# Patient Record
Sex: Male | Born: 1970 | Race: White | Hispanic: No | Marital: Married | Smoking: Never smoker
Health system: Southern US, Community
[De-identification: ages and names within clinical notes are randomized; demographics above are authoritative.]

## PROBLEM LIST (undated history)

## (undated) DIAGNOSIS — K219 Gastro-esophageal reflux disease without esophagitis: Secondary | ICD-10-CM

## (undated) HISTORY — PX: TOE SURGERY: SHX1073

---

## 2012-06-15 ENCOUNTER — Emergency Department (HOSPITAL_COMMUNITY)
Admission: EM | Admit: 2012-06-15 | Discharge: 2012-06-16 | Disposition: A | Discharge status: Home / Self Care | Payer: BC Managed Care – PPO | Attending: Emergency Medicine | Admitting: Emergency Medicine

## 2012-06-15 ENCOUNTER — Encounter (HOSPITAL_COMMUNITY): Payer: Self-pay | Admitting: *Deleted

## 2012-06-15 DIAGNOSIS — Z881 Allergy status to other antibiotic agents status: Secondary | ICD-10-CM | POA: Insufficient documentation

## 2012-06-15 DIAGNOSIS — R079 Chest pain, unspecified: Secondary | ICD-10-CM

## 2012-06-15 LAB — POCT I-STAT TROPONIN I: Troponin i, poc: 0 ng/mL (ref 0.00–0.08)

## 2012-06-15 LAB — CBC
Platelets: 257 10*3/uL (ref 150–400)
RBC: 4.84 MIL/uL (ref 4.22–5.81)
RDW: 12.2 % (ref 11.5–15.5)
WBC: 6.7 10*3/uL (ref 4.0–10.5)

## 2012-06-15 LAB — BASIC METABOLIC PANEL
CO2: 29 mEq/L (ref 19–32)
Chloride: 103 mEq/L (ref 96–112)
GFR calc Af Amer: >90 mL/min (ref >90)
Potassium: 3.8 mEq/L (ref 3.5–5.1)
Sodium: 139 mEq/L (ref 135–145)

## 2012-06-15 MED ORDER — NITROGLYCERIN 0.4 MG SL SUBL: 0.4000 mg | SUBLINGUAL_TABLET | SUBLINGUAL | Status: DC | PRN

## 2012-06-15 MED ORDER — ASPIRIN 81 MG PO CHEW
324.0000 mg | CHEWABLE_TABLET | Freq: Once | ORAL | Status: AC
  Administered 2012-06-16: 324 mg via ORAL
  Filled 2012-06-15: qty 4

## 2012-06-15 NOTE — ED Notes (Signed)
Pt in c/o intermittent chest pain over the last week, increased today in pain and frequency, described as sharp pain to left side of chest, some shortness of breath with this, some radiation into his left arm and neck

## 2012-06-16 ENCOUNTER — Emergency Department (HOSPITAL_COMMUNITY): Payer: BC Managed Care – PPO

## 2012-06-16 LAB — D-DIMER, QUANTITATIVE: D-Dimer, Quant: 0.28 ug/mL-FEU (ref 0.00–0.48)

## 2012-06-16 MED ORDER — FAMOTIDINE 20 MG PO TABS: 20.0000 mg | ORAL_TABLET | Freq: Two times a day (BID) | ORAL | Status: DC

## 2012-06-16 NOTE — ED Notes (Signed)
The patient is AOx4 and comfortable with the discharge instructions. 

## 2012-06-16 NOTE — ED Provider Notes (Signed)
History     CSN: [id number]  Arrival date & time 06/15/12  2131   First MD Initiated Contact with Patient 06/15/12 2351      Chief Complaint  Patient presents with  . Chest Pain    (Consider location/radiation/quality/duration/timing/severity/associated sxs/prior treatment) HPI Hx per PT - CP L sided on and off for the last week.  Described as piercing sharp pains that come and go, no known aggravating or alleviating factors, pain lasts seconds, is catching which makes him feel like he cant catch his breath. No recent travel or surgery, denies any h/o tob use or and PMHx. No FH CAD. He had an episode like this about a year ago, it went away without intervention and he did not seek any medical care at that time. Currently pain free. No leg pain, swelling or h/o DVT/ PE. Over the last few days has had some L arm discomfort that feels like he slept on it wrong - he does not feel like this is related to these sharp CPs. He does get heartburn and takes occ Tums  History reviewed. No pertinent past medical history.  No past surgical history on file.  No family history on file.  History  Substance Use Topics  . Smoking status: Not on file  . Smokeless tobacco: Not on file  . Alcohol Use: Not on file      Review of Systems  Constitutional: Negative for fever and chills.  HENT: Negative for neck pain and neck stiffness.   Eyes: Negative for pain.  Respiratory: Negative for cough.   Cardiovascular: Positive for chest pain.  Gastrointestinal: Negative for abdominal pain.  Genitourinary: Negative for dysuria.  Musculoskeletal: Negative for back pain.  Skin: Negative for rash.  Neurological: Negative for headaches.  All other systems reviewed and are negative.    Allergies  Vancomycin  Home Medications  No current outpatient prescriptions on file.  BP 119/73  Pulse 62  Temp(Src) 98.2 F (36.8 C) (Oral)  Resp 12  SpO2 97%  Physical Exam  Constitutional: He is oriented  to person, place, and time. He appears well-developed and well-nourished.  HENT:  Head: Normocephalic and atraumatic.  Eyes: Conjunctivae and EOM are normal. Pupils are equal, round, and reactive to light.  Neck: Trachea normal. Neck supple. No thyromegaly present.  Cardiovascular: Normal rate, regular rhythm, S1 normal, S2 normal and normal pulses.     No systolic murmur is present   No diastolic murmur is present  Pulses:      Radial pulses are 2+ on the right side, and 2+ on the left side.  Pulmonary/Chest: Effort normal and breath sounds normal. He has no wheezes. He has no rhonchi. He has no rales. He exhibits no tenderness.  Abdominal: Soft. Normal appearance and bowel sounds are normal. There is no tenderness. There is no CVA tenderness and negative Murphy's sign.  Musculoskeletal:  calves nontender, no cords or erythema, negative Homans sign  Neurological: He is alert and oriented to person, place, and time. He has normal strength. No cranial nerve deficit or sensory deficit. GCS eye subscore is 4. GCS verbal subscore is 5. GCS motor subscore is 6.  Skin: Skin is warm and dry. No rash noted. He is not diaphoretic.  Psychiatric: His speech is normal.  Cooperative and appropriate    ED Course  Procedures (including critical care time)  Results for orders placed during the hospital encounter of 06/15/12  CBC      Result Value Range  WBC 6.7  4.0 - 10.5 K/uL   RBC 4.84  4.22 - 5.81 MIL/uL   Hemoglobin 14.9  13.0 - 17.0 g/dL   HCT 16.1  09.6 - 04.5 %   MCV 86.8  78.0 - 100.0 fL   MCH 30.8  26.0 - 34.0 pg   MCHC 35.5  30.0 - 36.0 g/dL   RDW 40.9  81.1 - 91.4 %   Platelets 257  150 - 400 K/uL  BASIC METABOLIC PANEL      Result Value Range   Sodium 139  135 - 145 mEq/L   Potassium 3.8  3.5 - 5.1 mEq/L   Chloride 103  96 - 112 mEq/L   CO2 29  19 - 32 mEq/L   Glucose, Bld 86  70 - 99 mg/dL   BUN 17  6 - 23 mg/dL   Creatinine, Ser 7.82  0.50 - 1.35 mg/dL   Calcium 9.8  8.4  - 95.6 mg/dL   GFR calc non Af Amer >90  >90 mL/min   GFR calc Af Amer >90  >90 mL/min  D-DIMER, QUANTITATIVE      Result Value Range   D-Dimer, Quant 0.28  0.00 - 0.48 ug/mL-FEU  POCT I-STAT TROPONIN I      Result Value Range   Troponin i, poc 0.00  0.00 - 0.08 ng/mL   Comment 3            Dg Chest Portable 1 View  06/16/2012   *RADIOLOGY REPORT*  Clinical Data: Chest pain shortness of breath  PORTABLE CHEST - 1 VIEW  Comparison: None.  Findings: Cardiac leads project over the chest.  Heart, mediastinal, and hilar contours are normal.  The lungs are normally expanded and clear.  Negative for pleural effusion or pneumothorax. Bony thorax is unremarkable.  IMPRESSION: No acute cardiopulmonary disease.   Original Report Authenticated By: Britta Mccreedy, M.D.    Date: 06/16/2012  Rate: 68  Rhythm: normal sinus rhythm  QRS Axis: normal  Intervals: normal  ST/T Wave abnormalities: nonspecific ST changes  Conduction Disutrbances:none  Narrative Interpretation:   Old EKG Reviewed: none available  ASA  1:52 AM pain free in the ED, plan follow up outpatient cardiac stress testing and with PCP. CP precautions provided and verbalized as understood.   MDM  CP x one week, atypical for ACS and low risk for the same. H/o gerd  ECG, labs, CXR  ASA  VS and nursing notes reviewed        Sunnie Nielsen, MD 06/16/12 [phone]

## 2012-07-11 DIAGNOSIS — R079 Chest pain, unspecified: Secondary | ICD-10-CM

## 2014-06-27 IMAGING — CR DG CHEST 1V PORT
1 series · 1 of 1 positions shown · non-contrast
Comparison: None.

CLINICAL DATA: Chest pain shortness of breath

PORTABLE CHEST - 1 VIEW

[AP]
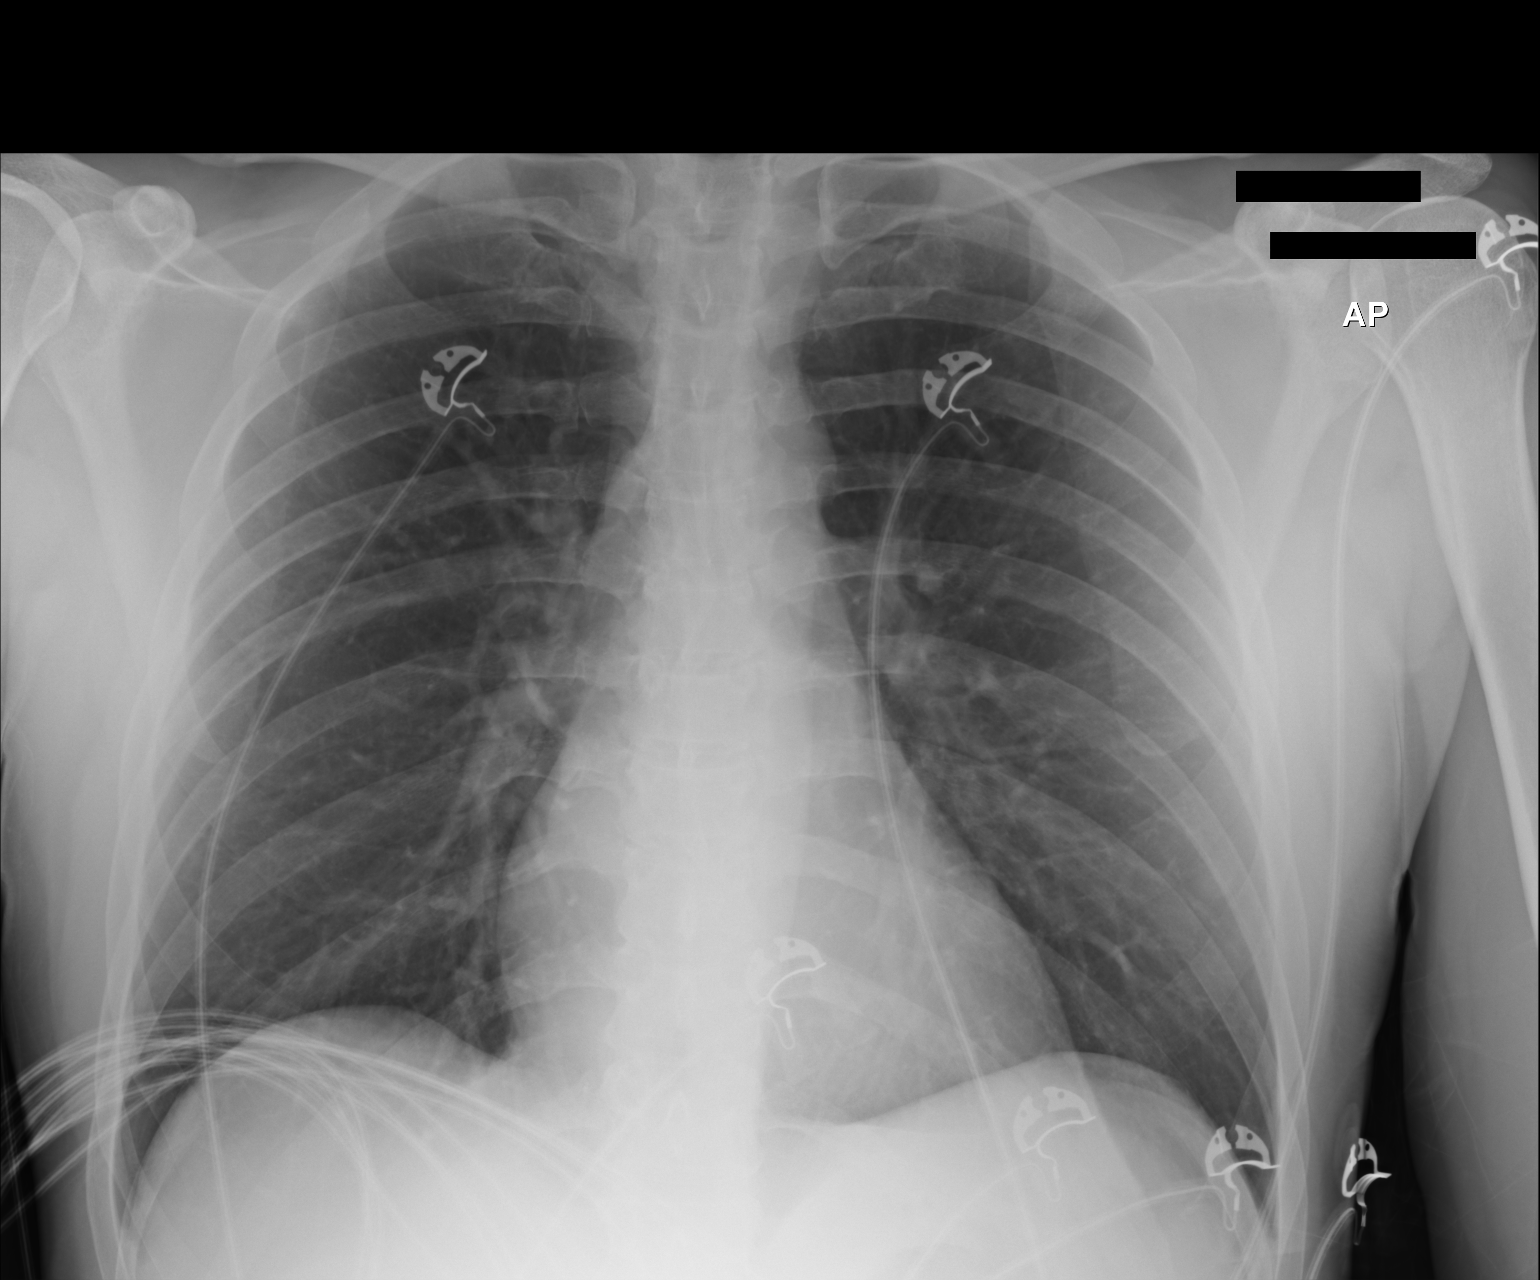

[1 of 1 positions shown; findings below may reference images not displayed]

FINDINGS: Cardiac leads project over the chest.  Heart,
mediastinal, and hilar contours are normal.  The lungs are normally
expanded and clear.  Negative for pleural effusion or pneumothorax.
Bony thorax is unremarkable.
IMPRESSION: No acute cardiopulmonary disease.

## 2020-02-25 ENCOUNTER — Encounter (INDEPENDENT_AMBULATORY_CARE_PROVIDER_SITE_OTHER): Payer: Self-pay | Admitting: *Deleted

## 2020-05-25 ENCOUNTER — Encounter (INDEPENDENT_AMBULATORY_CARE_PROVIDER_SITE_OTHER): Payer: Self-pay | Admitting: Gastroenterology

## 2020-05-25 ENCOUNTER — Other Ambulatory Visit: Payer: Self-pay

## 2020-05-25 ENCOUNTER — Telehealth (INDEPENDENT_AMBULATORY_CARE_PROVIDER_SITE_OTHER): Payer: Commercial Managed Care - PPO | Admitting: Gastroenterology

## 2020-05-25 DIAGNOSIS — R1319 Other dysphagia: Secondary | ICD-10-CM

## 2020-05-25 DIAGNOSIS — K219 Gastro-esophageal reflux disease without esophagitis: Secondary | ICD-10-CM | POA: Diagnosis not present

## 2020-05-25 DIAGNOSIS — R1310 Dysphagia, unspecified: Secondary | ICD-10-CM | POA: Insufficient documentation

## 2020-05-25 MED ORDER — PANTOPRAZOLE SODIUM 40 MG PO TBEC: 40.0000 mg | DELAYED_RELEASE_TABLET | Freq: Every day | ORAL | 3 refills | Status: DC

## 2020-05-25 NOTE — Patient Instructions (Signed)
Schedule EGD and colonoscopy Decrease pantoprazole to 40 mg qday

## 2020-05-25 NOTE — Progress Notes (Signed)
Michael Fox, M.D. Gastroenterology & Hepatology [facility] [address] [place], [place] [address] Primary Care Physician: Michael Hilding, MD Michael Fox [id number]  Referring MD: PCP  This is a telephone virtual visit.  It required patient-provider interaction for the medical decision making as documented below. The patient has consented and agreed to proceed with a Telehealth encounter given the current Coronavirus pandemic.  VIRTUAL VISIT NOTE Patient location: home Provider location: home  I will communicate my assessment and recommendations to the referring MD via EMR.  Chief Complaint: Heartburn and dysphagia  History of Present Illness: Michael Fox is a 50 y.o. male with PMH GERD, who presents for evaluation of  Heartburn.  Patient reports that close to a year and a half ago he presented new onset of heartburn. The symptoms were quite frequent in the past, he cannot remember how often he had him. He reports that he started taking pantoprazole 40 mg qday at least a year ago which was prescribed by his PCP, usually 45 minutes before his breakfast.  He states he was switched to twice a day dosing in a subsequent appointment 2 months ago as it seems he had recurrent symptoms but he does not remember exactly if he had significant symptoms at that time. The patient reports that he has been taking pantoprazole 40 mg twice a day non compliantly, as he takes it 80% of the time in the afternoon, as sometimes he forgets the evening dose . Nevertheless, he has not presented any breakthrough heartburn episodes despite skipping the doses.  He reports that all his life he has presented some episodes of feeling the "food got stuck" in the chest when he eats very fast. Sometimes he has had to induce his vomit as food does not go down.  Never had a food impaction episode in the past.  The patient also states he has felt a lump in  his perianal area. His PCP evaluated this told him the mass was a skin tag but he would like to have this checked in a colonoscopy.  The patient denies having any odynophagia, nausea, vomiting, fever, chills, hematochezia, melena, hematemesis, abdominal distention, abdominal pain, diarrhea, jaundice, pruritus or weight gain/loss.  Last EGD: never Last Colonoscopy: never  FHx: neg for any gastrointestinal/liver disease, father had cancer but unknown type, mother breast cancer Social: neg smoking, alcohol or illicit drug use Surgical: non contributory  Past Medical History:GERD  Past Surgical History:History reviewed. No pertinent surgical history.  Family History:History reviewed. No pertinent family history.  Social History: Social History   Tobacco Use  Smoking Status Never Smoker  Smokeless Tobacco Never Used   Social History   Substance and Sexual Activity  Alcohol Use Yes   Comment: occasional   Social History   Substance and Sexual Activity  Drug Use Never    Allergies: Allergies  Allergen Reactions  . Vancomycin Itching    Medications: Current Outpatient Medications  Medication Sig Dispense Refill  . pantoprazole (PROTONIX) 40 MG tablet Take 1 tablet by mouth 2 (two) times daily.     No current facility-administered medications for this visit.    Review of Systems: GENERAL: negative for malaise, night sweats HEENT: No changes in hearing or vision, no nose bleeds or other nasal problems. NECK: Negative for lumps, goiter, pain and significant neck swelling RESPIRATORY: Negative for cough, wheezing CARDIOVASCULAR: Negative for chest pain, leg swelling, palpitations, orthopnea GI: SEE HPI MUSCULOSKELETAL: Negative  for joint pain or swelling, back pain, and muscle pain. SKIN: Negative for lesions, rash PSYCH: Negative for sleep disturbance, mood disorder and recent psychosocial stressors. HEMATOLOGY Negative for prolonged bleeding, bruising easily, and  swollen nodes. ENDOCRINE: Negative for cold or heat intolerance, polyuria, polydipsia and goiter. NEURO: negative for tremor, gait imbalance, syncope and seizures. The remainder of the review of systems is noncontributory.   Physical Exam: No exam was performed as this was a telephone encounter  Imaging/Labs: as above  I personally reviewed and interpreted the available labs, imaging and endoscopic files.  Impression and Plan: Michael Fox is a 50 y.o. male with PMH GERD, who presents for evaluation of heartburn.  The patient had presence of frequent symptoms of heartburn which have responded to the intake of PPI, which makes a diagnosis of GERD very likely.  I am not sure if he is having any benefit from twice a day dosing, I recommended patient to decrease his dosage to once a day dosing.  He is currently taking the medication in a proper fashion which I encouraged him to keep doing.  Nevertheless, he has presented some episodes of dysphagia which will be important to be evaluated with an EGD.  Patient understood and agreed.  We will also schedule a colonoscopy for screening purposes as he has never had 1 in the past.  This will also allow [place] to evaluate his rectal complaint.  - Schedule EGD and colonoscopy - Decrease pantoprazole to 40 mg qday  All questions were answered.      Total visit time: I spent a total of  40 minutes  Michael Peppers, MD Gastroenterology and Hepatology [facility]

## 2020-05-26 ENCOUNTER — Encounter (INDEPENDENT_AMBULATORY_CARE_PROVIDER_SITE_OTHER): Payer: Self-pay

## 2020-05-26 ENCOUNTER — Telehealth (INDEPENDENT_AMBULATORY_CARE_PROVIDER_SITE_OTHER): Payer: Self-pay

## 2020-05-26 MED ORDER — PEG 3350-KCL-NA BICARB-NACL 420 G PO SOLR: 4000.0000 mL | ORAL | 0 refills | Status: DC

## 2020-05-26 NOTE — Telephone Encounter (Signed)
LeighAnn Tashera Montalvo, CMA  

## 2020-06-29 ENCOUNTER — Ambulatory Visit (HOSPITAL_COMMUNITY): Payer: Commercial Managed Care - PPO | Admitting: Anesthesiology

## 2020-06-29 ENCOUNTER — Other Ambulatory Visit: Payer: Self-pay

## 2020-06-29 ENCOUNTER — Ambulatory Visit (HOSPITAL_COMMUNITY)
Admission: RE | Admit: 2020-06-29 | Discharge: 2020-06-29 | Disposition: A | Discharge status: Home / Self Care | Payer: Commercial Managed Care - PPO | Attending: Gastroenterology | Admitting: Gastroenterology

## 2020-06-29 ENCOUNTER — Encounter (HOSPITAL_COMMUNITY): Payer: Self-pay | Admitting: Gastroenterology

## 2020-06-29 ENCOUNTER — Encounter (HOSPITAL_COMMUNITY)
Admission: RE | Disposition: A | Discharge status: Home / Self Care | Payer: Self-pay | Source: Home / Self Care | Attending: Gastroenterology

## 2020-06-29 DIAGNOSIS — R1310 Dysphagia, unspecified: Secondary | ICD-10-CM | POA: Insufficient documentation

## 2020-06-29 DIAGNOSIS — D3A8 Other benign neuroendocrine tumors: Secondary | ICD-10-CM | POA: Insufficient documentation

## 2020-06-29 DIAGNOSIS — K219 Gastro-esophageal reflux disease without esophagitis: Secondary | ICD-10-CM | POA: Diagnosis not present

## 2020-06-29 DIAGNOSIS — K621 Rectal polyp: Secondary | ICD-10-CM

## 2020-06-29 DIAGNOSIS — Z881 Allergy status to other antibiotic agents status: Secondary | ICD-10-CM | POA: Diagnosis not present

## 2020-06-29 DIAGNOSIS — Z1211 Encounter for screening for malignant neoplasm of colon: Secondary | ICD-10-CM | POA: Insufficient documentation

## 2020-06-29 DIAGNOSIS — Z79899 Other long term (current) drug therapy: Secondary | ICD-10-CM | POA: Diagnosis not present

## 2020-06-29 DIAGNOSIS — D120 Benign neoplasm of cecum: Secondary | ICD-10-CM | POA: Insufficient documentation

## 2020-06-29 HISTORY — PX: COLONOSCOPY WITH PROPOFOL: SHX5780

## 2020-06-29 HISTORY — PX: BIOPSY: SHX5522

## 2020-06-29 HISTORY — PX: POLYPECTOMY: SHX149

## 2020-06-29 HISTORY — PX: ESOPHAGOGASTRODUODENOSCOPY (EGD) WITH PROPOFOL: SHX5813

## 2020-06-29 LAB — HM COLONOSCOPY

## 2020-06-29 SURGERY — COLONOSCOPY WITH PROPOFOL
Anesthesia: General

## 2020-06-29 MED ORDER — GLYCOPYRROLATE 0.2 MG/ML IJ SOLN
INTRAMUSCULAR | Status: DC | PRN
  Administered 2020-06-29: .2 mg via INTRAVENOUS

## 2020-06-29 MED ORDER — PROPOFOL 10 MG/ML IV BOLUS
INTRAVENOUS | Status: DC | PRN
  Administered 2020-06-29: 100 mg via INTRAVENOUS
  Administered 2020-06-29: 150 ug/kg/min via INTRAVENOUS

## 2020-06-29 MED ORDER — STERILE WATER FOR IRRIGATION IR SOLN
Status: DC | PRN
  Administered 2020-06-29: 200 mL

## 2020-06-29 MED ORDER — LACTATED RINGERS IV SOLN
INTRAVENOUS | Status: DC
  Administered 2020-06-29: 1000 mL via INTRAVENOUS

## 2020-06-29 MED ORDER — LIDOCAINE HCL (CARDIAC) PF 100 MG/5ML IV SOSY
PREFILLED_SYRINGE | INTRAVENOUS | Status: DC | PRN
  Administered 2020-06-29: 100 mg via INTRAVENOUS

## 2020-06-29 NOTE — Op Note (Addendum)
[facility] Patient Name: Michael Fox Procedure Date: 06/29/2020 9:27 AM MRN: [id number] Date of Birth: [birth year] Attending MD: Maylon Peppers ,  CSN: [id number] Age: 50 Admit Type: Outpatient Procedure:                Colonoscopy Indications:              Screening for colorectal malignant neoplasm Providers:                Maylon Peppers, Robins Page, Alma Risa Grill, Technician Referring MD:              Medicines:                Monitored Anesthesia Care Complications:            No immediate complications. Estimated Blood Loss:     Estimated blood loss: none. Procedure:                Pre-Anesthesia Assessment:                           - Prior to the procedure, a History and Physical                            was performed, and patient medications, allergies                            and sensitivities were reviewed. The patient's                            tolerance of previous anesthesia was reviewed.                           - The risks and benefits of the procedure and the                            sedation options and risks were discussed with the                            patient. All questions were answered and informed                            consent was obtained.                           - ASA Grade Assessment: II - A patient with mild                            systemic disease.                           After obtaining informed consent, the colonoscope                            was passed under direct vision. Throughout the  procedure, the patient's blood pressure, pulse, and                            oxygen saturations were monitored continuously. The                            [id number]) was introduced through the anus                            and advanced to the the cecum, identified by                            appendiceal orifice and ileocecal valve. The                             colonoscopy was performed without difficulty. The                            patient tolerated the procedure well. The quality                            of the bowel preparation was good. Scope In: 9:30:59 AM Scope Out: 10:07:03 AM Scope Withdrawal Time: 0 hours 24 minutes 14 seconds  Total Procedure Duration: 0 hours 36 minutes 4 seconds  Findings:      The perianal and digital rectal examinations were normal.      A 2 mm polyp was found in the cecum. The polyp was sessile. The polyp       was removed with a cold biopsy forceps. Resection and retrieval were       complete.      A 8 mm polyp was found in the rectum. The polyp was sessile. I iitially       attempted to removed the polyp with a 10 mm cold snare but it could not       be transected as the base of the polyp The polyp was removed with a hot       snare. Resection and retrieval were complete.      The retroflexed view of the distal rectum and anal verge was normal and       showed no anal or rectal abnormalities. Impression:               - One 2 mm polyp in the cecum, removed with a cold                            biopsy forceps. Resected and retrieved.                           - One 8 mm polyp in the rectum, removed with a hot                            snare. Resected and retrieved.                           - The distal rectum and anal verge are normal on  retroflexion view. Moderate Sedation:      Per Anesthesia Care Recommendation:           - Discharge patient to home (ambulatory).                           - Resume previous diet.                           - Await pathology results.                           - Repeat colonoscopy for surveillance based on                            pathology results. Procedure Code(s):        --- Professional ---                           [id number], Colonoscopy, flexible; with removal of                            tumor(s), polyp(s), or other  lesion(s) by snare                            technique                           45380, 82, Colonoscopy, flexible; with biopsy,                            single or multiple Diagnosis Code(s):        --- Professional ---                           Z12.11, Encounter for screening for malignant                            neoplasm of colon                           K63.5, Polyp of colon                           K62.1, Rectal polyp CPT copyright 2019 American [employer] Association. All rights reserved. The codes documented in this report are preliminary and upon coder review may  be revised to meet current compliance requirements. Maylon Peppers, MD Maylon Peppers,  06/29/2020 10:17:20 AM This report has been signed electronically. Number of Addenda: 0

## 2020-06-29 NOTE — Anesthesia Postprocedure Evaluation (Signed)
Anesthesia Post Note  Patient: Michael Fox  Procedure(s) Performed: COLONOSCOPY WITH PROPOFOL ESOPHAGOGASTRODUODENOSCOPY (EGD) WITH PROPOFOL BIOPSY POLYPECTOMY INTESTINAL  Patient location during evaluation: Phase II Anesthesia Type: General Level of consciousness: awake Pain management: pain level controlled Vital Signs Assessment: post-procedure vital signs reviewed and stable Respiratory status: spontaneous breathing and respiratory function stable Cardiovascular status: blood pressure returned to baseline and stable Postop Assessment: no headache and no apparent nausea or vomiting Anesthetic complications: no Comments: Late entry   No notable events documented.   Last Vitals:  Vitals:   06/29/20 0813 06/29/20 1011  BP: (!) 132/96 (!) 90/59  Pulse: 97   Resp: 15 11  Temp: 36.9 C 36.6 C  SpO2: 99% 99%    Last Pain:  Vitals:   06/29/20 1011  TempSrc: Oral  PainSc: 0-No pain                 Louann Sjogren

## 2020-06-29 NOTE — Discharge Instructions (Addendum)
You are being discharged to home.  Resume your previous diet.  We are waiting for your pathology results.  Continue your present medications.  Repeat colonoscopy for surveillance based on pathology results.

## 2020-06-29 NOTE — H&P (Signed)
Michael Fox is an 50 y.o. male.   Chief Complaint: dysphagia, GERD and CRC screening HPI: Michael Fox is a 50 y.o. male with PMH GERD, who presents for evaluation of  Heartburn, dysphagia and colorectal cancer screening  States that his current dose of pantoprazole 40 mg every day has controlled his heartburn episodes.  He denies having any odynophagia but has presented very seldom dysphagia episodes with intake of solid food.  No food impaction episodes.  The patient has never had a colonoscopy in the past.  The patient denies having any complaints such as melena, hematochezia, abdominal pain or distention, change in her bowel movement consistency or frequency, no changes in her weight recently.  No family history of colorectal cancer.   History reviewed. No pertinent past medical history.  Past Surgical History:  Procedure Laterality Date   TOE SURGERY     fracture    History reviewed. No pertinent family history. Social History:  reports that he has never smoked. He has quit using smokeless tobacco.  His smokeless tobacco use included snuff and chew. He reports current alcohol use. He reports that he does not use drugs.  Allergies:  Allergies  Allergen Reactions   Vancomycin Itching    Medications Prior to Admission  Medication Sig Dispense Refill   pantoprazole (PROTONIX) 40 MG tablet Take 1 tablet (40 mg total) by mouth daily. 90 tablet 3   polyethylene glycol-electrolytes (TRILYTE) 420 g solution Take 4,000 mLs by mouth as directed. 4000 mL 0    No results found for this or any previous visit (from the past 48 hour(s)). No results found.  Review of Systems  Constitutional: Negative.   HENT:  Positive for trouble swallowing.   Eyes: Negative.   Respiratory: Negative.    Cardiovascular: Negative.   Gastrointestinal: Negative.   Endocrine: Negative.   Genitourinary: Negative.   Musculoskeletal: Negative.   Skin: Negative.   Allergic/Immunologic: Negative.    Neurological: Negative.   Hematological: Negative.   Psychiatric/Behavioral: Negative.     Blood pressure (!) 132/96, pulse 97, temperature 98.4 F (36.9 C), temperature source Oral, resp. rate 15, height 5\' 8"  (1.727 m), weight 65.8 kg, SpO2 99 %. Physical Exam  GENERAL: The patient is AO x3, in no acute distress. HEENT: Head is normocephalic and atraumatic. EOMI are intact. Mouth is well hydrated and without lesions. NECK: Supple. No masses LUNGS: Clear to auscultation. No presence of rhonchi/wheezing/rales. Adequate chest expansion HEART: RRR, normal s1 and s2. ABDOMEN: Soft, nontender, no guarding, no peritoneal signs, and nondistended. BS +. No masses. EXTREMITIES: Without any cyanosis, clubbing, rash, lesions or edema. NEUROLOGIC: AOx3, no focal motor deficit. SKIN: no jaundice, no rashes  Assessment/Plan Michael Fox is a 50 y.o. male with PMH GERD, who presents for evaluation of  Heartburn, dysphagia and colorectal cancer screening.  We will proceed with EGD and colonoscopy.  Harvel Quale, MD 06/29/2020, 9:02 AM

## 2020-06-29 NOTE — Transfer of Care (Signed)
Immediate Anesthesia Transfer of Care Note  Patient: CODEN FRANCHI  Procedure(s) Performed: COLONOSCOPY WITH PROPOFOL ESOPHAGOGASTRODUODENOSCOPY (EGD) WITH PROPOFOL BIOPSY POLYPECTOMY INTESTINAL  Patient Location: Endoscopy Unit  Anesthesia Type:General  Level of Consciousness: awake, drowsy, patient cooperative and responds to stimulation  Airway & Oxygen Therapy: Patient Spontanous Breathing  Post-op Assessment: Report given to RN, Post -op Vital signs reviewed and stable and Patient moving all extremities  Post vital signs: Reviewed and stable  Last Vitals:  Vitals Value Taken Time  BP    Temp    Pulse    Resp    SpO2      Last Pain:  Vitals:   06/29/20 0908  TempSrc:   PainSc: 0-No pain      Patients Stated Pain Goal: 7 (07/12/17 7588)  Complications: No notable events documented.

## 2020-06-29 NOTE — Anesthesia Preprocedure Evaluation (Signed)
Anesthesia Evaluation  Patient identified by MRN, date of birth, ID band Patient awake    Reviewed: Allergy & Precautions, H&P , NPO status , Patient's Chart, lab work & pertinent test results, reviewed documented beta blocker date and time   Airway Mallampati: II  TM Distance: >3 FB Neck ROM: full    Dental no notable dental hx.    Pulmonary neg pulmonary ROS,    Pulmonary exam normal breath sounds clear to auscultation       Cardiovascular Exercise Tolerance: Good negative cardio ROS   Rhythm:regular Rate:Normal     Neuro/Psych negative neurological ROS  negative psych ROS   GI/Hepatic Neg liver ROS, GERD  Medicated,  Endo/Other  negative endocrine ROS  Renal/GU negative Renal ROS  negative genitourinary   Musculoskeletal   Abdominal   Peds  Hematology negative hematology ROS (+)   Anesthesia Other Findings   Reproductive/Obstetrics negative OB ROS                             Anesthesia Physical Anesthesia Plan  ASA: 1  Anesthesia Plan: General   Post-op Pain Management:    Induction:   PONV Risk Score and Plan: Propofol infusion  Airway Management Planned:   Additional Equipment:   Intra-op Plan:   Post-operative Plan:   Informed Consent: I have reviewed the patients History and Physical, chart, labs and discussed the procedure including the risks, benefits and alternatives for the proposed anesthesia with the patient or authorized representative who has indicated his/her understanding and acceptance.     Dental Advisory Given  Plan Discussed with: CRNA  Anesthesia Plan Comments:         Anesthesia Quick Evaluation

## 2020-06-29 NOTE — Op Note (Signed)
[facility] Patient Name: Michael Fox Procedure Date: 06/29/2020 9:01 AM MRN: [id number] Date of Birth: July 02, 1970 Attending MD: Maylon Peppers ,  CSN: [id number] Age: 50 Admit Type: Outpatient Procedure:                Upper GI endoscopy Indications:              Dysphagia Providers:                Maylon Peppers, Crystal Page, [place] Risa Grill, Technician Referring MD:              Medicines:                Monitored Anesthesia Care Complications:            No immediate complications. Estimated Blood Loss:     Estimated blood loss: none. Procedure:                Pre-Anesthesia Assessment:                           - Prior to the procedure, a History and Physical                            was performed, and patient medications, allergies                            and sensitivities were reviewed. The patient's                            tolerance of previous anesthesia was reviewed.                           - The risks and benefits of the procedure and the                            sedation options and risks were discussed with the                            patient. All questions were answered and informed                            consent was obtained.                           - ASA Grade Assessment: II - A patient with mild                            systemic disease.                           After obtaining informed consent, the endoscope was                            passed under direct vision. Throughout the  procedure, the patient's blood pressure, pulse, and                            oxygen saturations were monitored continuously. The                            GIF-H190 ([id number]) scope was introduced through the                            mouth, and advanced to the second part of duodenum.                            The upper GI endoscopy was accomplished without                             difficulty. The patient tolerated the procedure                            well. Scope In: 9:16:45 AM Scope Out: 9:25:09 AM Total Procedure Duration: 0 hours 8 minutes 24 seconds  Findings:      The examined esophagus was normal. Biopsies were obtained from the       proximal and distal esophagus with cold forceps for histology of       suspected eosinophilic esophagitis.      The Z-line was regular and was found 40 cm from the incisors.      The entire examined stomach was normal.      The examined duodenum was normal. Impression:               - Normal esophagus. Biopsied.                           - Z-line regular, 40 cm from the incisors.                           - Normal stomach.                           - Normal examined duodenum. Moderate Sedation:      Per Anesthesia Care Recommendation:           - Discharge patient to home (ambulatory).                           - Resume previous diet.                           - Await pathology results.                           - Continue present medications. Procedure Code(s):        --- Professional ---                           [phone], Esophagogastroduodenoscopy, flexible,  transoral; with biopsy, single or multiple Diagnosis Code(s):        --- Professional ---                           R13.10, Dysphagia, unspecified CPT copyright 2019 American Medical Association. All rights reserved. The codes documented in this report are preliminary and upon coder review may  be revised to meet current compliance requirements. Maylon Peppers, MD Maylon Peppers,  06/29/2020 9:29:23 AM This report has been signed electronically. Number of Addenda: 0

## 2020-07-01 LAB — SURGICAL PATHOLOGY

## 2020-07-05 ENCOUNTER — Encounter (INDEPENDENT_AMBULATORY_CARE_PROVIDER_SITE_OTHER): Payer: Self-pay | Admitting: *Deleted

## 2020-07-06 ENCOUNTER — Encounter (HOSPITAL_COMMUNITY): Payer: Self-pay | Admitting: Gastroenterology

## 2020-07-06 ENCOUNTER — Telehealth (INDEPENDENT_AMBULATORY_CARE_PROVIDER_SITE_OTHER): Payer: Self-pay | Admitting: Gastroenterology

## 2020-07-06 NOTE — Telephone Encounter (Signed)
Patient left voice mail message stating he had a colonoscopy on 6/29 - is to have a repeat colonoscopy because of what was found - please advise - ph# [phone]

## 2020-07-11 ENCOUNTER — Other Ambulatory Visit (INDEPENDENT_AMBULATORY_CARE_PROVIDER_SITE_OTHER): Payer: Self-pay

## 2020-07-11 ENCOUNTER — Telehealth (INDEPENDENT_AMBULATORY_CARE_PROVIDER_SITE_OTHER): Payer: Self-pay

## 2020-07-11 ENCOUNTER — Encounter (INDEPENDENT_AMBULATORY_CARE_PROVIDER_SITE_OTHER): Payer: Self-pay

## 2020-07-11 MED ORDER — MAGNESIUM CITRATE PO SOLN: 1.0000 | Freq: Once | ORAL | 0 refills | Status: AC

## 2020-07-11 NOTE — Telephone Encounter (Signed)
Dr Jenetta Downer Please Call Weyman Pedro again regarding the results of his Pathology Report, Thanks

## 2020-07-11 NOTE — Telephone Encounter (Signed)
LeighAnn Praneeth Bussey, CMA  

## 2020-07-11 NOTE — Telephone Encounter (Signed)
Spoke to patient about the pathology report, questions were clarified.

## 2020-08-16 NOTE — Patient Instructions (Signed)
MANLEY TUNKS  08/16/2020     '@PREFPERIOPPHARMACY'$ @   Your procedure is scheduled on  08/23/2020.   Report to [facility] at  [phone]  A.M.   Call this number if you have problems the morning of surgery:  [phone]   Remember:  Follow the diet and prep instructions given to you by the office.    Take these medicines the morning of surgery with A SIP OF WATER                                       protonix.     Do not wear jewelry, make-up or nail polish.  Do not wear lotions, powders, or perfumes, or deodorant.  Do not shave 48 hours prior to surgery.  Men may shave face and neck.  Do not bring valuables to the hospital.  [facility] is not responsible for any belongings or valuables.  Contacts, dentures or bridgework may not be worn into surgery.  Leave your suitcase in the car.  After surgery it may be brought to your room.  For patients admitted to the hospital, discharge time will be determined by your treatment team.  Patients discharged the day of surgery will not be allowed to drive home and must have someone with them for 24 hours.    Special instructions:   DO NOT smoke tobacco or vape for 24 hours before your procedure.  Please read over the following fact sheets that you were given. Anesthesia Post-op Instructions and Care and Recovery After Surgery      Flexible Sigmoidoscopy, Care After This sheet gives you information about how to care for yourself after your procedure. Your health care provider may also give you more specific instructions. If you have problems or questions, contact your health careprovider. What can I expect after the procedure? After the procedure, it is common to have: Cramping or pain in your abdomen. Bloating. A small amount of blood with your bowel movements. This may happen if a sample of tissue was removed for testing (biopsy). Follow these instructions at home: Eating and drinking  Drink enough fluid to keep your  urine pale yellow. Follow instructions from your health care provider about eating or drinking restrictions. Resume your normal diet as instructed by your health care provider. Avoid heavy or fried foods that are hard to digest.  Activity  If you were given a medicine to help you relax (sedative) during the procedure, it can affect you for several hours. Do not drive or operate machinery until your health care provider says that it is safe. Rest as told by your health care provider. Return to your normal activities as told by your health care provider. Ask your health care provider what activities are safe for you.  General instructions Take over-the-counter and prescription medicines only as told by your health care provider. Try walking around when you have cramps or feel bloated. Keep all follow-up visits as told by your health care provider. This is important. Contact a health care provider if: You have pain or cramping in your abdomen that gets worse or is not helped with medicine. You have a small amount of bleeding from your rectum that continues after 24 hours. You have nausea or vomiting. You feel weak or dizzy. You develop a fever. Get help right away if: You pass large blood clots or see  a large amount of blood in the toilet after having a bowel movement. You have severe pain in your abdomen. You have nausea or vomiting for more than 24 hours after the procedure. Summary After the procedure, you may have cramping or pain in your abdomen or you may have bloating. If you had a sample of tissue removed (biopsy), you may have a small amount of blood with your bowel movements. Resume your normal diet as instructed by your health care provider. Avoid heavy or fried foods that are hard to digest. Try walking around when you have cramps or feel bloated. Get help right away if you pass large blood clots or see a large amount of blood in the toilet after having a bowel movement. This  information is not intended to replace advice given to you by your health care provider. Make sure you discuss any questions you have with your healthcare provider. Document Revised: 12/15/2018 Document Reviewed: 12/15/2018 Elsevier Patient Education  2022 [place] After This sheet gives you information about how to care for yourself after your procedure. Your health care provider may also give you more specific instructions. If you have problems or questions, contact your health careprovider. What can I expect after the procedure? After the procedure, it is common to have: Tiredness. Forgetfulness about what happened after the procedure. Impaired judgment for important decisions. Nausea or vomiting. Some difficulty with balance. Follow these instructions at home: For the time period you were told by your health care provider:     Rest as needed. Do not participate in activities where you could fall or become injured. Do not drive or use machinery. Do not drink alcohol. Do not take sleeping pills or medicines that cause drowsiness. Do not make important decisions or sign legal documents. Do not take care of children on your own. Eating and drinking Follow the diet that is recommended by your health care provider. Drink enough fluid to keep your urine pale yellow. If you vomit: Drink water, juice, or soup when you can drink without vomiting. Make sure you have little or no nausea before eating solid foods. General instructions Have a responsible adult stay with you for the time you are told. It is important to have someone help care for you until you are awake and alert. Take over-the-counter and prescription medicines only as told by your health care provider. If you have sleep apnea, surgery and certain medicines can increase your risk for breathing problems. Follow instructions from your health care provider about wearing your sleep  device: Anytime you are sleeping, including during daytime naps. While taking prescription pain medicines, sleeping medicines, or medicines that make you drowsy. Avoid smoking. Keep all follow-up visits as told by your health care provider. This is important. Contact a health care provider if: You keep feeling nauseous or you keep vomiting. You feel light-headed. You are still sleepy or having trouble with balance after 24 hours. You develop a rash. You have a fever. You have redness or swelling around the IV site. Get help right away if: You have trouble breathing. You have new-onset confusion at home. Summary For several hours after your procedure, you may feel tired. You may also be forgetful and have poor judgment. Have a responsible adult stay with you for the time you are told. It is important to have someone help care for you until you are awake and alert. Rest as told. Do not drive or operate machinery. Do not drink  alcohol or take sleeping pills. Get help right away if you have trouble breathing, or if you suddenly become confused. This information is not intended to replace advice given to you by your health care provider. Make sure you discuss any questions you have with your healthcare provider. Document Revised: 09/03/2019 Document Reviewed: 11/20/2018 Elsevier Patient Education  2022 [employer].

## 2020-08-19 ENCOUNTER — Encounter (HOSPITAL_COMMUNITY)
Admission: RE | Admit: 2020-08-19 | Discharge: 2020-08-19 | Disposition: A | Discharge status: Home / Self Care | Payer: Commercial Managed Care - PPO | Source: Ambulatory Visit | Attending: Gastroenterology | Admitting: Gastroenterology

## 2020-08-19 ENCOUNTER — Other Ambulatory Visit: Payer: Self-pay

## 2020-08-19 ENCOUNTER — Encounter (HOSPITAL_COMMUNITY): Payer: Self-pay

## 2020-08-19 HISTORY — DX: Gastro-esophageal reflux disease without esophagitis: K21.9

## 2020-08-23 ENCOUNTER — Ambulatory Visit (HOSPITAL_COMMUNITY): Payer: Commercial Managed Care - PPO | Admitting: Anesthesiology

## 2020-08-23 ENCOUNTER — Encounter (HOSPITAL_COMMUNITY)
Admission: RE | Disposition: A | Discharge status: Home / Self Care | Payer: Self-pay | Source: Home / Self Care | Attending: Gastroenterology

## 2020-08-23 ENCOUNTER — Ambulatory Visit (HOSPITAL_COMMUNITY)
Admission: RE | Admit: 2020-08-23 | Discharge: 2020-08-23 | Disposition: A | Discharge status: Home / Self Care | Payer: Commercial Managed Care - PPO | Attending: Gastroenterology | Admitting: Gastroenterology

## 2020-08-23 ENCOUNTER — Encounter (HOSPITAL_COMMUNITY): Payer: Self-pay | Admitting: Gastroenterology

## 2020-08-23 ENCOUNTER — Other Ambulatory Visit: Payer: Self-pay

## 2020-08-23 DIAGNOSIS — Z9889 Other specified postprocedural states: Secondary | ICD-10-CM | POA: Diagnosis not present

## 2020-08-23 DIAGNOSIS — Z87891 Personal history of nicotine dependence: Secondary | ICD-10-CM | POA: Insufficient documentation

## 2020-08-23 DIAGNOSIS — Z881 Allergy status to other antibiotic agents status: Secondary | ICD-10-CM | POA: Insufficient documentation

## 2020-08-23 DIAGNOSIS — Z09 Encounter for follow-up examination after completed treatment for conditions other than malignant neoplasm: Secondary | ICD-10-CM | POA: Insufficient documentation

## 2020-08-23 DIAGNOSIS — Z85038 Personal history of other malignant neoplasm of large intestine: Secondary | ICD-10-CM

## 2020-08-23 DIAGNOSIS — D3A8 Other benign neuroendocrine tumors: Secondary | ICD-10-CM | POA: Insufficient documentation

## 2020-08-23 DIAGNOSIS — Z79899 Other long term (current) drug therapy: Secondary | ICD-10-CM | POA: Insufficient documentation

## 2020-08-23 DIAGNOSIS — Z86010 Personal history of colonic polyps: Secondary | ICD-10-CM | POA: Insufficient documentation

## 2020-08-23 HISTORY — PX: ENDOSCOPIC MUCOSAL RESECTION: SHX6839

## 2020-08-23 HISTORY — PX: FLEXIBLE SIGMOIDOSCOPY: SHX5431

## 2020-08-23 LAB — HM COLONOSCOPY

## 2020-08-23 SURGERY — SIGMOIDOSCOPY, FLEXIBLE
Anesthesia: General

## 2020-08-23 MED ORDER — LIDOCAINE HCL (CARDIAC) PF 100 MG/5ML IV SOSY
PREFILLED_SYRINGE | INTRAVENOUS | Status: DC | PRN
  Administered 2020-08-23: 50 mg via INTRAVENOUS

## 2020-08-23 MED ORDER — LACTATED RINGERS IV SOLN: INTRAVENOUS | Status: DC

## 2020-08-23 MED ORDER — PROPOFOL 500 MG/50ML IV EMUL
INTRAVENOUS | Status: DC | PRN
  Administered 2020-08-23: 150 ug/kg/min via INTRAVENOUS

## 2020-08-23 MED ORDER — PROPOFOL 10 MG/ML IV BOLUS
INTRAVENOUS | Status: DC | PRN
  Administered 2020-08-23: 100 mg via INTRAVENOUS

## 2020-08-23 NOTE — Anesthesia Procedure Notes (Signed)
Date/Time: 08/23/2020 8:03 AM Performed by: Orlie Dakin, CRNA Pre-anesthesia Checklist: Patient identified, Emergency Drugs available, Suction available and Patient being monitored Patient Re-evaluated:Patient Re-evaluated prior to induction Oxygen Delivery Method: Nasal cannula Induction Type: IV induction Placement Confirmation: positive ETCO2

## 2020-08-23 NOTE — Op Note (Addendum)
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 Estimated Blood Loss:     Estimated blood loss: none. Procedure:                Pre-Anesthesia Assessment:                           - Prior to the procedure, a History and Physical                            was performed, and patient medications, allergies                            and sensitivities were reviewed. The patient's                            tolerance of previous anesthesia was reviewed.                           - The risks and benefits of the procedure and the                            sedation options and risks were discussed with the                            patient. All questions were answered and informed                            consent was obtained.                           - ASA Grade Assessment: II - A patient with mild                            systemic disease.                           After obtaining informed consent, the  scope was                            passed under direct vision. The GIF-H190 ([id number])                            scope was introduced through the anus and advanced                            to the the sigmoid colon. The flexible                            sigmoidoscopy was accomplished without difficulty.                            The patient tolerated the procedure well. The                             quality of the bowel preparation was good. Scope In: 8:06:17 AM Scope Out: 8:26:28 AM Total Procedure Duration: 0 hours 20 minutes 11 seconds  Findings:      The perianal and digital rectal examinations were normal.      A 4 mm post polypectomy scar was found in the proximal rectum. The scar       tissue was healthy in appearance. Imaging was performed using white       light and narrow band imaging to visualize the mucosa. No major       alterations were seen in the mucosa. However, as there was evidence of       histologic involvement at the base of the lesion previously (well       differentiated NET), decision was made to perform band EMR. Throught the       use of a rubber variceal banding kit, the scar area was banded with one       rubber band. There was no bleeding during the procedure. Imaging was       performed using white light and narrow band imaging to visualize the       mucosa - the banded area where the scar was located was demarcated with       the rubber band for EMR purposes. The area was removed with a hot snare.       Resection and retrieval were complete. To close the defect, one       hemostatic clip was successfully placed. There was no bleeding at the       end of the procedure. Impression:               - Post-polypectomy scar in the proximal rectum -                            this was previous location of NET. Removed via band                            EMR. Clip was placed.                           - No specimens collected. Moderate Sedation:  Per Anesthesia Care Recommendation:           - Discharge patient to home (ambulatory).                           - Resume previous diet.                           - Await pathology results. Procedure Code(s):        --- Professional ---                           [phone], 59, Sigmoidoscopy, flexible; diagnostic,                            including collection of specimen(s) by brushing or                             washing, when performed (separate procedure) Diagnosis Code(s):        --- Professional ---                           [phone], Other specified postprocedural states                           Z85.038, Personal history of other malignant                            neoplasm of large intestine CPT copyright 2019 American Medical Association. All rights reserved. The codes documented in this report are preliminary and upon coder review may  be revised to meet current compliance requirements. Maylon Peppers, MD Maylon Peppers,  08/23/2020 8:44:40 AM This report has been signed electronically. Number of Addenda: 0

## 2020-08-23 NOTE — Discharge Instructions (Signed)
You are being discharged to home.  Resume your previous diet.  We are waiting for your pathology results.  

## 2020-08-23 NOTE — Anesthesia Postprocedure Evaluation (Signed)
Anesthesia Post Note  Patient: Michael Fox  Procedure(s) Performed: FLEXIBLE SIGMOIDOSCOPY ENDOSCOPIC MUCOSAL RESECTION  Patient location during evaluation: Phase II Anesthesia Type: General Level of consciousness: awake and alert and oriented Pain management: pain level controlled Vital Signs Assessment: post-procedure vital signs reviewed and stable Respiratory status: spontaneous breathing and respiratory function stable Cardiovascular status: blood pressure returned to baseline and stable Postop Assessment: no apparent nausea or vomiting Anesthetic complications: no   No notable events documented.   Last Vitals:  Vitals:   08/23/20 0832 08/23/20 0836  BP:  104/77  Pulse: 66   Resp: 16   Temp: 36.7 C   SpO2: 100%     Last Pain:  Vitals:   08/23/20 0832  TempSrc: Axillary  PainSc: 0-No pain                 Keyetta Hollingworth C Zahara Rembert

## 2020-08-23 NOTE — Transfer of Care (Signed)
Immediate Anesthesia Transfer of Care Note  Patient: Michael Fox  Procedure(s) Performed: FLEXIBLE SIGMOIDOSCOPY ENDOSCOPIC MUCOSAL RESECTION  Patient Location: Short Stay  Anesthesia Type:General  Level of Consciousness: awake, alert  and oriented  Airway & Oxygen Therapy: Patient Spontanous Breathing  Post-op Assessment: Report given to RN and Post -op Vital signs reviewed and stable  Post vital signs: Reviewed and stable  Last Vitals:  Vitals Value Taken Time  BP    Temp    Pulse    Resp    SpO2      Last Pain:  Vitals:   08/23/20 0800  TempSrc:   PainSc: 0-No pain      Patients Stated Pain Goal: 6 ([phone] [id number])  Complications: No notable events documented.

## 2020-08-23 NOTE — H&P (Signed)
Michael Fox is an 50 y.o. male.   Chief Complaint: Rectal neuroendocrine tumor HPI: Michael Fox is a 50 y.o. 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  The patient underwent a colonoscopy on 06/29/2020 as part of colorectal cancer screening. He was found to have a 2 mm polyp in the cecum which was a tubular adenoma.  An 8 mm polyp was found in the rectum which was initially attempted to be removed with a cold snare but he had to be resected with a hot snare eventually as he was attached in the base family.  Pathology came back positive for a well-differentiated neuroendocrine tumor with presence of focal involvement of the base.  Past Medical History:  Diagnosis Date   GERD (gastroesophageal reflux disease)     Past Surgical History:  Procedure Laterality Date   BIOPSY  06/29/2020   Procedure: BIOPSY;  Surgeon: Harvel Quale, MD;  [facility];  Service: Gastroenterology;;   COLONOSCOPY WITH PROPOFOL N/A 06/29/2020   Procedure: COLONOSCOPY WITH PROPOFOL;  Surgeon: Harvel Quale, MD;  Location: [facility];  Service: Gastroenterology;  Laterality: N/A;  9:15   ESOPHAGOGASTRODUODENOSCOPY (EGD) WITH PROPOFOL N/A 06/29/2020   Procedure: ESOPHAGOGASTRODUODENOSCOPY (EGD) WITH PROPOFOL;  Surgeon: Harvel Quale, MD;  Location: [facility];  Service: Gastroenterology;  Laterality: N/A;   POLYPECTOMY  06/29/2020   Procedure: POLYPECTOMY INTESTINAL;  Surgeon: Harvel Quale, MD;  Location: [facility];  Service: Gastroenterology;;   TOE SURGERY     fracture    History reviewed. No pertinent family history. Social History:  reports that he has never smoked. He has quit using smokeless tobacco.  His smokeless tobacco use included snuff and chew. He reports current alcohol use. He reports that he does not use drugs.  Allergies:  Allergies  Allergen Reactions   Vancomycin Itching     Medications Prior to Admission  Medication Sig Dispense Refill   pantoprazole (PROTONIX) 40 MG tablet Take 1 tablet (40 mg total) by mouth daily. 90 tablet 3    No results found for this or any previous visit (from the past 48 hour(s)). No results found.  Review of Systems  Constitutional: Negative.   HENT: Negative.    Eyes: Negative.   Respiratory: Negative.    Cardiovascular: Negative.   Gastrointestinal: Negative.   Endocrine: Negative.   Genitourinary: Negative.   Musculoskeletal: Negative.   Skin: Negative.   Allergic/Immunologic: Negative.   Neurological: Negative.   Hematological: Negative.   Psychiatric/Behavioral: Negative.     Blood pressure 120/84, temperature 98.5 F (36.9 C), temperature source Oral, resp. rate 17, SpO2 98 %. Physical Exam  GENERAL: The patient is AO x3, in no acute distress. HEENT: Head is normocephalic and atraumatic. EOMI are intact. Mouth is well hydrated and without lesions. NECK: Supple. No masses LUNGS: Clear to auscultation. No presence of rhonchi/wheezing/rales. Adequate chest expansion HEART: RRR, normal s1 and s2. ABDOMEN: Soft, nontender, no guarding, no peritoneal signs, and nondistended. BS +. No masses. EXTREMITIES: Without any cyanosis, clubbing, rash, lesions or edema. NEUROLOGIC: AOx3, no focal motor deficit. SKIN: no jaundice, no rashes  Assessment/Plan Michael Fox is a 50 y.o. 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  We will proceed with flexible sigmoidoscopy for cap Endoscopic mucosal resection of the area where the lesion was located previously.  Harvel Quale, MD 08/23/2020, 7:28 AM

## 2020-08-23 NOTE — Anesthesia Preprocedure Evaluation (Signed)
Anesthesia Evaluation  Patient identified by MRN, date of birth, ID band Patient awake    Reviewed: Allergy & Precautions, NPO status , Patient's Chart, lab work & pertinent test results  History of Anesthesia Complications Negative for: history of anesthetic complications  Airway Mallampati: II  TM Distance: >3 FB Neck ROM: Full    Dental  (+) Dental Advisory Given, Teeth Intact   Pulmonary neg pulmonary ROS,    Pulmonary exam normal breath sounds clear to auscultation       Cardiovascular Exercise Tolerance: Good Normal cardiovascular exam Rhythm:Regular Rate:Normal     Neuro/Psych negative neurological ROS  negative psych ROS   GI/Hepatic Neg liver ROS, GERD  Medicated and Controlled,  Endo/Other  negative endocrine ROS  Renal/GU negative Renal ROS     Musculoskeletal   Abdominal   Peds  Hematology negative hematology ROS (+)   Anesthesia Other Findings   Reproductive/Obstetrics                            Anesthesia Physical Anesthesia Plan  ASA: 1  Anesthesia Plan: General   Post-op Pain Management:    Induction: Intravenous  PONV Risk Score and Plan: Propofol infusion  Airway Management Planned: Nasal Cannula and Natural Airway  Additional Equipment:   Intra-op Plan:   Post-operative Plan:   Informed Consent: I have reviewed the patients History and Physical, chart, labs and discussed the procedure including the risks, benefits and alternatives for the proposed anesthesia with the patient or authorized representative who has indicated his/her understanding and acceptance.     Dental advisory given  Plan Discussed with: CRNA and Surgeon  Anesthesia Plan Comments:        Anesthesia Quick Evaluation

## 2020-08-25 LAB — SURGICAL PATHOLOGY

## 2020-08-29 ENCOUNTER — Other Ambulatory Visit: Payer: Self-pay

## 2020-08-29 ENCOUNTER — Telehealth (INDEPENDENT_AMBULATORY_CARE_PROVIDER_SITE_OTHER): Payer: Self-pay

## 2020-08-29 ENCOUNTER — Encounter (HOSPITAL_COMMUNITY): Payer: Self-pay | Admitting: *Deleted

## 2020-08-29 ENCOUNTER — Emergency Department (HOSPITAL_COMMUNITY)
Admission: EM | Admit: 2020-08-29 | Discharge: 2020-08-29 | Disposition: A | Discharge status: Home / Self Care | Payer: Commercial Managed Care - PPO | Attending: Emergency Medicine | Admitting: Emergency Medicine

## 2020-08-29 DIAGNOSIS — K625 Hemorrhage of anus and rectum: Secondary | ICD-10-CM | POA: Insufficient documentation

## 2020-08-29 DIAGNOSIS — Z5321 Procedure and treatment not carried out due to patient leaving prior to being seen by health care provider: Secondary | ICD-10-CM | POA: Diagnosis not present

## 2020-08-29 LAB — CBC WITH DIFFERENTIAL/PLATELET
Abs Immature Granulocytes: 0.01 10*3/uL (ref 0.00–0.07)
Basophils Absolute: 0 10*3/uL (ref 0.0–0.1)
Basophils Relative: 1 %
Eosinophils Absolute: 0.1 10*3/uL (ref 0.0–0.5)
Eosinophils Relative: 1 %
HCT: 45.5 % (ref 39.0–52.0)
Hemoglobin: 15.3 g/dL (ref 13.0–17.0)
Immature Granulocytes: 0 %
Lymphocytes Relative: 22 %
Lymphs Abs: 1.2 10*3/uL (ref 0.7–4.0)
MCH: 30.8 pg (ref 26.0–34.0)
MCHC: 33.6 g/dL (ref 30.0–36.0)
MCV: 91.7 fL (ref 80.0–100.0)
Monocytes Absolute: 0.5 10*3/uL (ref 0.1–1.0)
Monocytes Relative: 9 %
Neutro Abs: 3.6 10*3/uL (ref 1.7–7.7)
Neutrophils Relative %: 67 %
Platelets: 259 10*3/uL (ref 150–400)
RBC: 4.96 MIL/uL (ref 4.22–5.81)
RDW: 12.8 % (ref 11.5–15.5)
WBC: 5.3 10*3/uL (ref 4.0–10.5)
nRBC: 0 % (ref 0.0–0.2)

## 2020-08-29 LAB — BASIC METABOLIC PANEL
Anion gap: 8 (ref 5–15)
BUN: 13 mg/dL (ref 6–20)
CO2: 25 mmol/L (ref 22–32)
Calcium: 8.8 mg/dL — ABNORMAL LOW (ref 8.9–10.3)
Chloride: 104 mmol/L (ref 98–111)
Creatinine, Ser: 1.04 mg/dL (ref 0.61–1.24)
GFR, Estimated: >60 mL/min (ref >60)
Glucose, Bld: 92 mg/dL (ref 70–99)
Potassium: 3.7 mmol/L (ref 3.5–5.1)
Sodium: 137 mmol/L (ref 135–145)

## 2020-08-29 LAB — TYPE AND SCREEN
ABO/RH(D): O NEG
Antibody Screen: NEGATIVE

## 2020-08-29 LAB — ABO/RH: ABO/RH(D): O NEG

## 2020-08-29 NOTE — ED Notes (Signed)
Pt denies any SOB or dizziness.  

## 2020-08-29 NOTE — ED Triage Notes (Signed)
Pt with bright red stool today. Recent colonoscopy with polyp and tumor removed back in June.  Had another procedure done on 8/23 to "clean up the tumor margins" and had a staple placed. Denies any blood thinners. Normal stool this morning and next stool at lunch was bloody.

## 2020-08-29 NOTE — Telephone Encounter (Signed)
Patient called stating he had a Flex Sig on 08/23/2020. He has been fine up until this afternoon he had a bm that contained a significant amount of bright red blood in it . He states he has had no pains,but he was concerned and is now at [employer].

## 2020-08-29 NOTE — Telephone Encounter (Signed)
Spoke with the patient's wife today, she reported that he had 1 episode of large rectal bleeding.  Possibly these are related bleeding after his band endoscopic mucosal resection.  He has not present any pain and no more rectal bleeding episodes.  He is currently in the ER waiting to get blood work-up performed.  We will notify the inpatient gastroenterology service.

## 2020-08-30 ENCOUNTER — Telehealth (INDEPENDENT_AMBULATORY_CARE_PROVIDER_SITE_OTHER): Payer: Self-pay

## 2020-08-30 NOTE — Telephone Encounter (Signed)
Spoke with the patient's wife today, she reported that he had 1 episode of large rectal bleeding.  Possibly these are related bleeding after his band endoscopic mucosal resection.  He has not present any pain and no more rectal bleeding episodes.  He is currently in the ER waiting to get blood work-up performed.  We will notify the inpatient gastroenterology service.       Note   Patient called stating he had a Flex Sig on 08/23/2020. He has been fine up until this afternoon he had a bm that contained a significant amount of bright red blood in it . He states he has had no pains,but he was concerned and is now at [employer].      Additional Documentation

## 2020-08-30 NOTE — Telephone Encounter (Signed)
Spoke to the patient as well, clincially doing fine , had a normal BM today

## 2020-08-30 NOTE — Telephone Encounter (Signed)
I called and spoke with patient to follow up from his Ed visit on 08/29/2020. Patient states he is much better today and had a solid bm this am, it was dark in color,but has not seen andy bright red blood in stools.

## 2020-08-31 ENCOUNTER — Encounter (INDEPENDENT_AMBULATORY_CARE_PROVIDER_SITE_OTHER): Payer: Self-pay | Admitting: *Deleted

## 2020-08-31 ENCOUNTER — Encounter (HOSPITAL_COMMUNITY): Payer: Self-pay | Admitting: Gastroenterology

## 2021-07-06 ENCOUNTER — Other Ambulatory Visit (INDEPENDENT_AMBULATORY_CARE_PROVIDER_SITE_OTHER): Payer: Self-pay | Admitting: Gastroenterology

## 2021-07-06 DIAGNOSIS — K219 Gastro-esophageal reflux disease without esophagitis: Secondary | ICD-10-CM

## 2021-07-06 NOTE — Telephone Encounter (Signed)
08/23/2020 Had TCS by Dr. Jenetta Downer

## 2022-08-02 ENCOUNTER — Other Ambulatory Visit (INDEPENDENT_AMBULATORY_CARE_PROVIDER_SITE_OTHER): Payer: Self-pay | Admitting: Gastroenterology

## 2022-08-02 DIAGNOSIS — K219 Gastro-esophageal reflux disease without esophagitis: Secondary | ICD-10-CM
